# Patient Record
Sex: Male | Born: 1995 | Race: White | Hispanic: No | Marital: Single
Health system: Southern US, Community
[De-identification: ages and names within clinical notes are randomized; demographics above are authoritative.]

## PROBLEM LIST (undated history)

## (undated) DIAGNOSIS — R51 Headache: Secondary | ICD-10-CM

## (undated) DIAGNOSIS — L0591 Pilonidal cyst without abscess: Secondary | ICD-10-CM

## (undated) DIAGNOSIS — R198 Other specified symptoms and signs involving the digestive system and abdomen: Secondary | ICD-10-CM

## (undated) HISTORY — PX: ADENOIDECTOMY: SUR15

## (undated) HISTORY — PX: UPPER GI ENDOSCOPY: SHX6162

## (undated) HISTORY — PX: TOOTH EXTRACTION: SUR596

## (undated) HISTORY — PX: TYMPANOSTOMY TUBE PLACEMENT: SHX32

---
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
## 2017-04-19 IMAGING — CT CT ANGIO CHEST
1 of 2 series · 18 of 30 positions shown · IV contrast (APPLIED)
Comparison: None.

CLINICAL DATA: Midsternal chest pain, nausea, and shortness of
breath starting around 11 o'clock this morning. Pain worsening
throughout the day.

EXAM:
CT ANGIOGRAPHY CHEST WITH CONTRAST
TECHNIQUE: Multidetector CT imaging of the chest was performed using the
standard protocol during bolus administration of intravenous
contrast. Multiplanar CT image reconstructions and MIPs were
obtained to evaluate the vascular anatomy.
CONTRAST:  100mL OMNIPAQUE IOHEXOL 350 MG/ML SOLN

[Series 5: pe 1.0 thins · axial · 0.82mm/px · z∈[-644,-384]mm · 18 of 293 slices shown]
[im 17/293  lung]
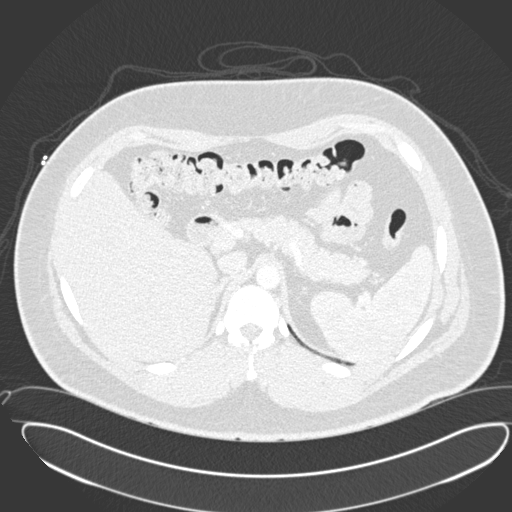
[im 33/293  mediastinal]
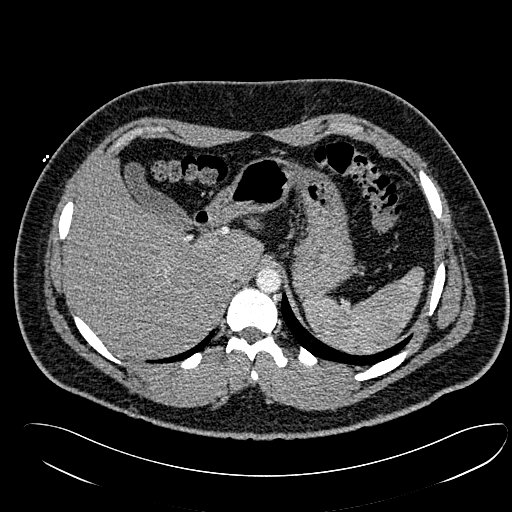
[im 49/293  lung]
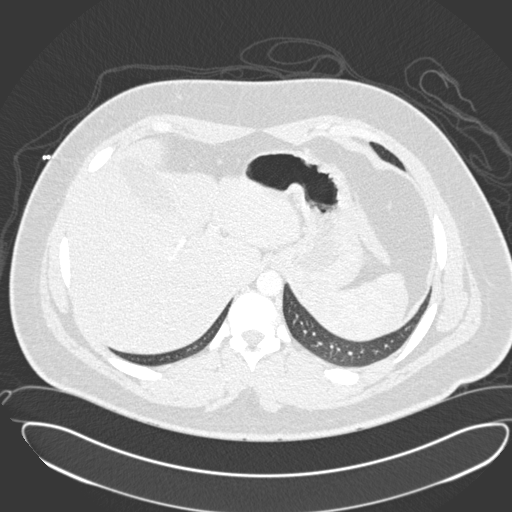
[im 65/293  mediastinal]
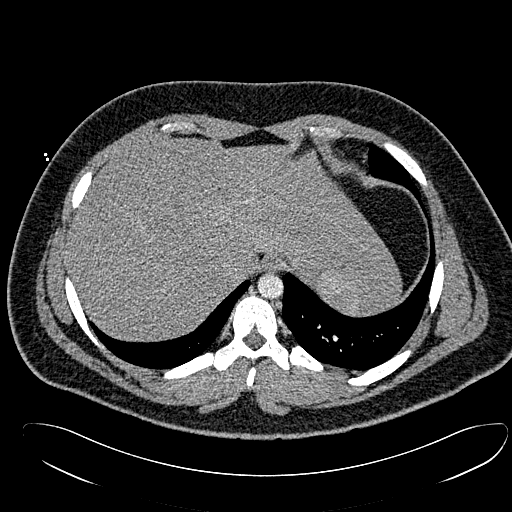
[im 82/293  lung]
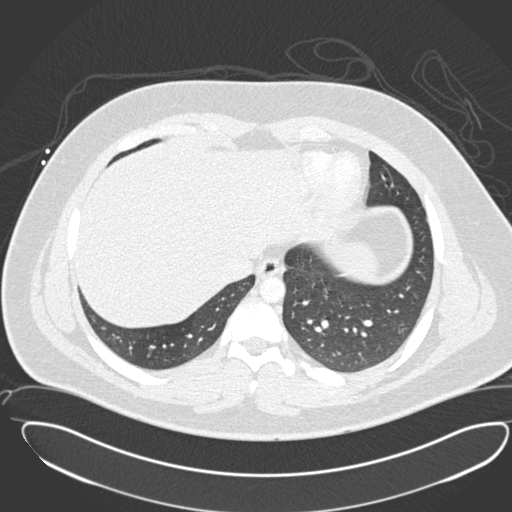
[im 98/293  mediastinal]
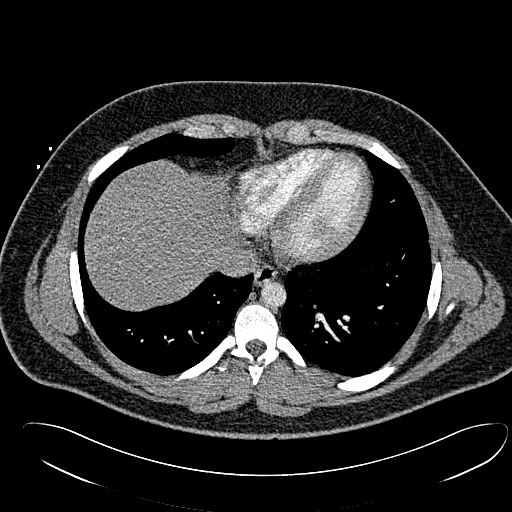
[im 114/293  lung]
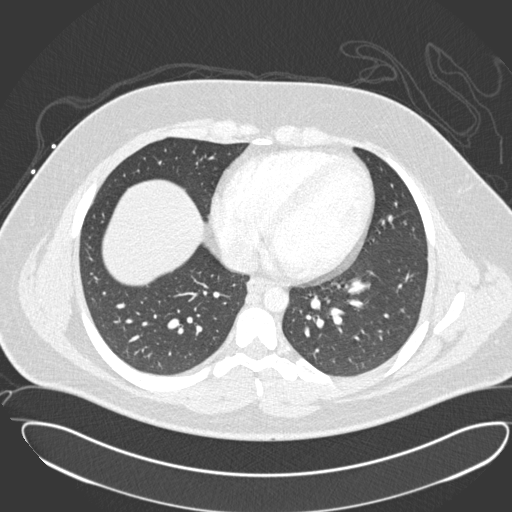
[im 130/293  mediastinal]
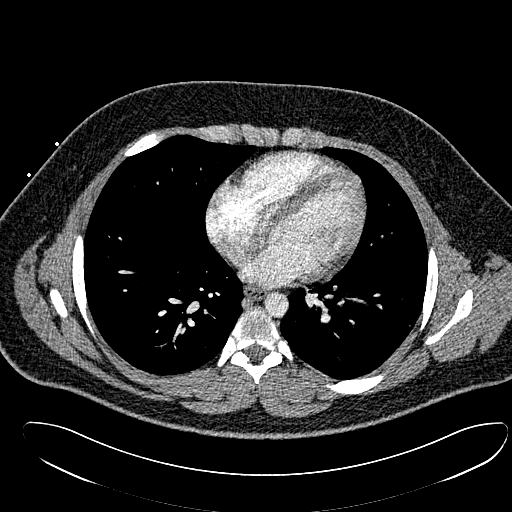
[im 137/293  lung]
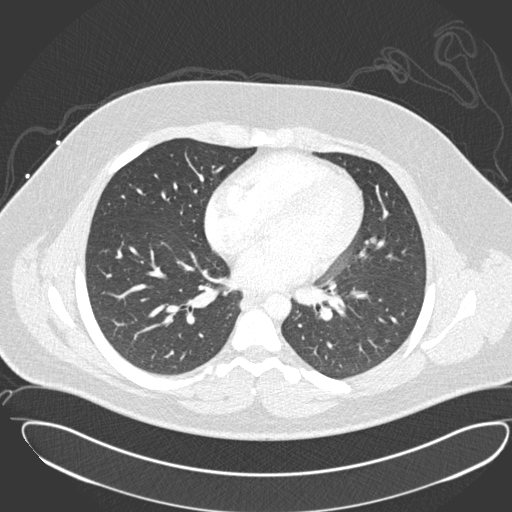
[im 147/293  mediastinal]
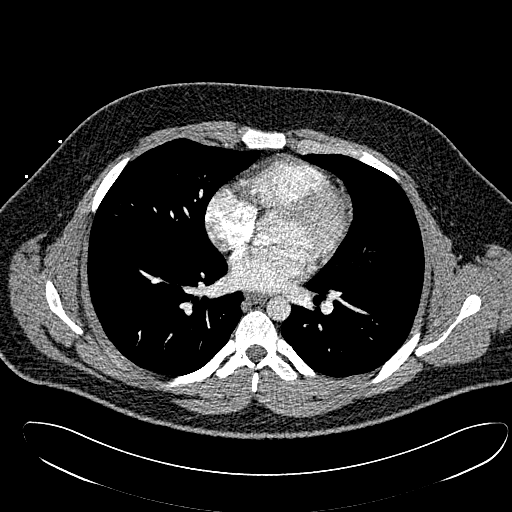
[im 163/293  lung]
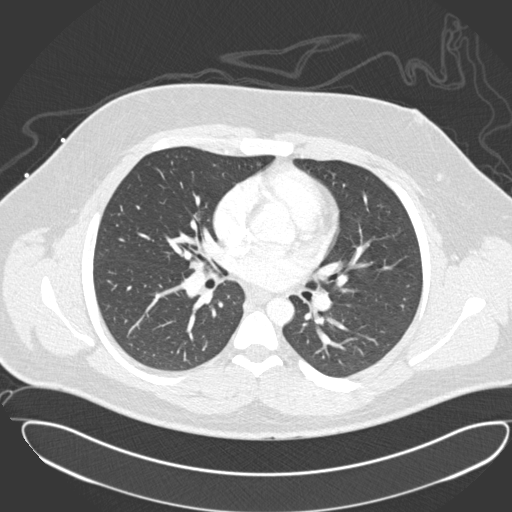
[im 179/293  mediastinal]
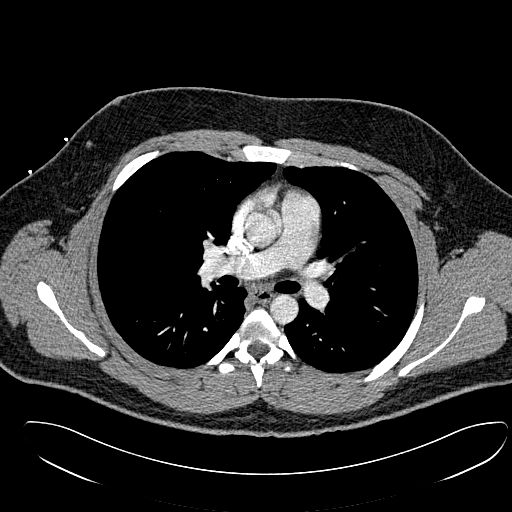
[im 195/293  lung]
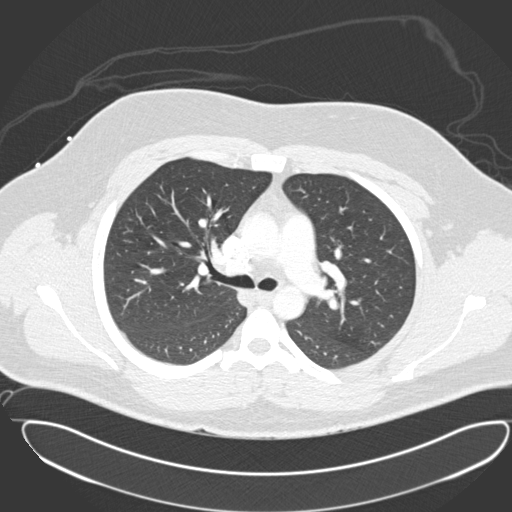
[im 211/293  mediastinal]
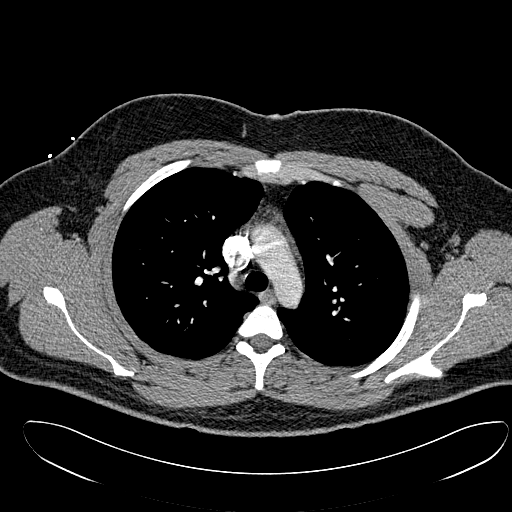
[im 228/293  lung]
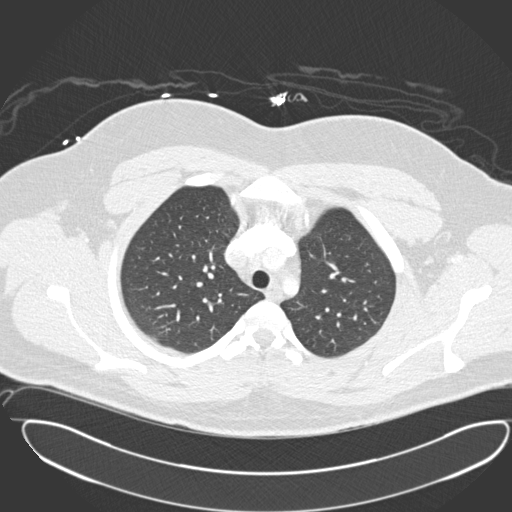
[im 244/293  mediastinal]
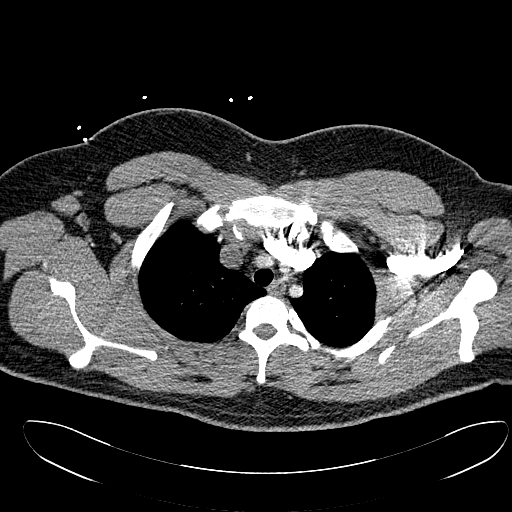
[im 260/293  lung]
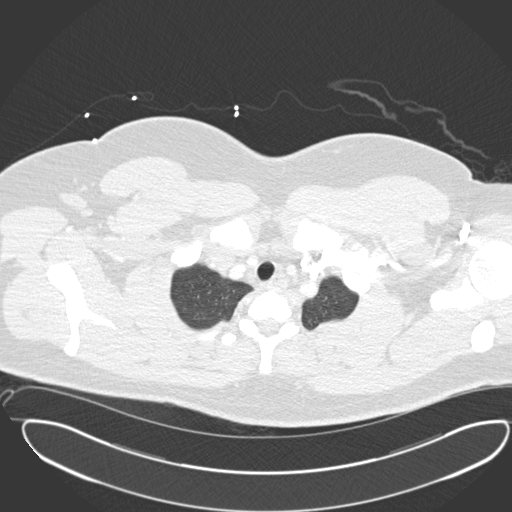
[im 276/293  mediastinal]
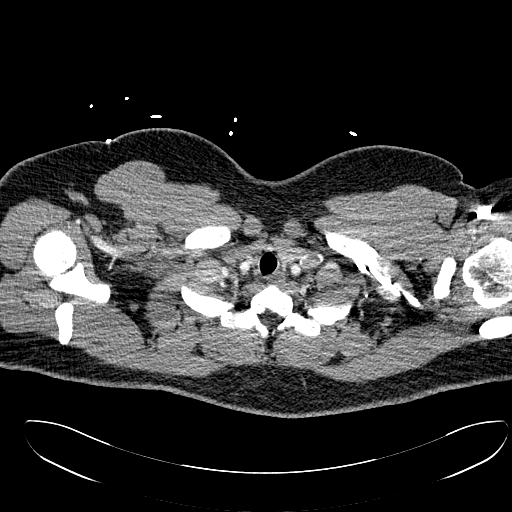

[18 of 30 positions shown; findings below may reference images not displayed]

FINDINGS: Technically limited study with poor opacification of the pulmonary
arteries due to bolus timing. The main pulmonary arteries are
moderately well seen and no large filling defects are demonstrated.
Segmental branch vessel pulmonary emboli cannot be excluded.

Normal heart size. Normal caliber thoracic aorta. No aortic
aneurysm. Probable residual thymic tissue in the anterior
mediastinum. Esophagus is decompressed. No significant
lymphadenopathy in the chest.

Lungs are clear. No focal airspace disease or consolidation. No
pneumothorax. No pleural effusions.

Included portions of the upper abdominal organs are grossly
unremarkable.

Review of the MIP images confirms the above findings.
IMPRESSION: Technically limited study. No large pulmonary embolus demonstrated
in the central vessels but examination is indeterminate for
evaluation of pulmonary emboli in the segmental and subsegmental
branches. No evidence of active pulmonary disease.

## 2017-10-11 ENCOUNTER — Other Ambulatory Visit: Payer: Self-pay | Admitting: Primary Care

## 2017-10-11 DIAGNOSIS — S0990XA Unspecified injury of head, initial encounter: Secondary | ICD-10-CM

## 2017-10-16 ENCOUNTER — Ambulatory Visit: Payer: BLUE CROSS/BLUE SHIELD

## 2017-10-30 ENCOUNTER — Ambulatory Visit: Admission: RE | Admit: 2017-10-30 | Payer: BLUE CROSS/BLUE SHIELD | Source: Ambulatory Visit

## 2021-07-13 ENCOUNTER — Other Ambulatory Visit: Payer: Self-pay | Admitting: Internal Medicine

## 2021-07-13 ENCOUNTER — Other Ambulatory Visit
Admission: RE | Admit: 2021-07-13 | Discharge: 2021-07-13 | Disposition: A | Discharge status: Home / Self Care | Payer: BC Managed Care – PPO | Source: Ambulatory Visit | Attending: Internal Medicine | Admitting: Internal Medicine

## 2021-07-13 ENCOUNTER — Other Ambulatory Visit (HOSPITAL_COMMUNITY): Payer: Self-pay | Admitting: Internal Medicine

## 2021-07-13 DIAGNOSIS — I2699 Other pulmonary embolism without acute cor pulmonale: Secondary | ICD-10-CM | POA: Diagnosis present

## 2021-07-13 DIAGNOSIS — R7989 Other specified abnormal findings of blood chemistry: Secondary | ICD-10-CM | POA: Diagnosis present

## 2021-07-13 DIAGNOSIS — R0602 Shortness of breath: Secondary | ICD-10-CM

## 2021-07-13 LAB — D-DIMER, QUANTITATIVE: D-Dimer, Quant: 0.34 ug/mL-FEU (ref 0.00–0.50)

## 2021-11-09 ENCOUNTER — Ambulatory Visit: Payer: BC Managed Care – PPO | Attending: Specialist

## 2021-11-09 DIAGNOSIS — G4733 Obstructive sleep apnea (adult) (pediatric): Secondary | ICD-10-CM | POA: Insufficient documentation

## 2022-01-03 ENCOUNTER — Other Ambulatory Visit: Payer: Self-pay | Admitting: Student

## 2022-01-03 DIAGNOSIS — R0789 Other chest pain: Secondary | ICD-10-CM

## 2022-01-06 ENCOUNTER — Ambulatory Visit
Admission: RE | Admit: 2022-01-06 | Discharge: 2022-01-06 | Disposition: A | Discharge status: Home / Self Care | Payer: BC Managed Care – PPO | Source: Ambulatory Visit | Attending: Student | Admitting: Student

## 2022-01-06 DIAGNOSIS — R0789 Other chest pain: Secondary | ICD-10-CM | POA: Insufficient documentation

## 2022-09-09 ENCOUNTER — Other Ambulatory Visit: Payer: Self-pay

## 2022-09-09 ENCOUNTER — Emergency Department
Admission: EM | Admit: 2022-09-09 | Discharge: 2022-09-09 | Disposition: A | Discharge status: Home / Self Care | Payer: BC Managed Care – PPO | Source: Home / Self Care | Attending: Emergency Medicine | Admitting: Emergency Medicine

## 2022-09-09 ENCOUNTER — Encounter: Payer: Self-pay | Admitting: Emergency Medicine

## 2022-09-09 ENCOUNTER — Emergency Department: Payer: BC Managed Care – PPO

## 2022-09-09 DIAGNOSIS — R1031 Right lower quadrant pain: Secondary | ICD-10-CM | POA: Insufficient documentation

## 2022-09-09 LAB — COMPREHENSIVE METABOLIC PANEL
ALT: 29 U/L (ref 0–44)
AST: 26 U/L (ref 15–41)
Albumin: 4 g/dL (ref 3.5–5.0)
Alkaline Phosphatase: 44 U/L (ref 38–126)
Anion gap: 8 (ref 5–15)
BUN: 9 mg/dL (ref 6–20)
CO2: 24 mmol/L (ref 22–32)
Calcium: 9.3 mg/dL (ref 8.9–10.3)
Chloride: 106 mmol/L (ref 98–111)
Creatinine, Ser: 1 mg/dL (ref 0.61–1.24)
GFR, Estimated: >60 mL/min (ref >60)
Glucose, Bld: 122 mg/dL — ABNORMAL HIGH (ref 70–99)
Potassium: 3.9 mmol/L (ref 3.5–5.1)
Sodium: 138 mmol/L (ref 135–145)
Total Bilirubin: 0.5 mg/dL (ref 0.3–1.2)
Total Protein: 7 g/dL (ref 6.5–8.1)

## 2022-09-09 LAB — URINALYSIS, ROUTINE W REFLEX MICROSCOPIC
Bilirubin Urine: NEGATIVE
Glucose, UA: NEGATIVE mg/dL
Hgb urine dipstick: NEGATIVE
Ketones, ur: NEGATIVE mg/dL
Leukocytes,Ua: NEGATIVE
Nitrite: NEGATIVE
Protein, ur: NEGATIVE mg/dL
Specific Gravity, Urine: 1.01 (ref 1.005–1.030)
pH: 7 (ref 5.0–8.0)

## 2022-09-09 LAB — CBC
HCT: 43.4 % (ref 39.0–52.0)
Hemoglobin: 14.4 g/dL (ref 13.0–17.0)
MCH: 28.4 pg (ref 26.0–34.0)
MCHC: 33.2 g/dL (ref 30.0–36.0)
MCV: 85.6 fL (ref 80.0–100.0)
Platelets: 310 10*3/uL (ref 150–400)
RBC: 5.07 MIL/uL (ref 4.22–5.81)
RDW: 13 % (ref 11.5–15.5)
WBC: 6.9 10*3/uL (ref 4.0–10.5)
nRBC: 0 % (ref 0.0–0.2)

## 2022-09-09 LAB — LIPASE, BLOOD: Lipase: 21 U/L (ref 11–51)

## 2022-09-09 MED ORDER — ONDANSETRON HCL 4 MG/2ML IJ SOLN
4.0000 mg | Freq: Once | INTRAMUSCULAR | Status: AC
  Administered 2022-09-09: 4 mg via INTRAVENOUS
  Filled 2022-09-09: qty 2

## 2022-09-09 MED ORDER — SODIUM CHLORIDE 0.9 % IV BOLUS
1000.0000 mL | Freq: Once | INTRAVENOUS | Status: AC
  Administered 2022-09-09: 1000 mL via INTRAVENOUS

## 2022-09-09 MED ORDER — IOHEXOL 300 MG/ML  SOLN
100.0000 mL | Freq: Once | INTRAMUSCULAR | Status: AC | PRN
  Administered 2022-09-09: 100 mL via INTRAVENOUS

## 2022-09-09 NOTE — ED Triage Notes (Signed)
Pt here with co lower abd pain x 2 days. Pt co nausea no n.v.d. Pt denies any dysuria.

## 2022-09-09 NOTE — ED Notes (Signed)
Triage done by Everlene Farrier RN, documented in error under [facility] EDT.

## 2022-09-09 NOTE — ED Provider Notes (Signed)
[facility] Provider Note    Event Date/Time   First MD Initiated Contact with Patient 09/09/22 1212     (approximate)   History   Abdominal Pain   HPI  Gerald Velazquez is a 27 y.o. male who presents today for evaluation of right lower quadrant pain.  He reports that his pain is worsened with walking.  He reports that he has nausea but has not vomited yet.  No diarrhea.  He reports that he does not have pain when pushing on the area.  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  He reports that he was told to go to the emergency department for evaluation of appendicitis.  There are no problems to display for this patient.         Physical Exam   Triage Vital Signs: ED Triage Vitals [09/09/22 1208]  Encounter Vitals Group     BP (!) 153/93     Systolic BP Percentile      Diastolic BP Percentile      Pulse Rate 96     Resp 17     Temp 99.2 F (37.3 C)     Temp src      SpO2 99 %     Weight 298 lb (135.2 kg)     Height 6\' 1"  (1.854 m)     Head Circumference      Peak Flow      Pain Score 3     Pain Loc      Pain Education      Exclude from Growth Chart     Most recent vital signs: Vitals:   09/09/22 1208  BP: (!) 153/93  Pulse: 96  Resp: 17  Temp: 99.2 F (37.3 C)  SpO2: 99%    Physical Exam Vitals and nursing note reviewed.  Constitutional:      General: Awake and alert. No acute distress.    Appearance: Normal appearance. The patient is obese.  HENT:     Head: Normocephalic and atraumatic.     Mouth: Mucous membranes are moist.  Eyes:     General: PERRL. Normal EOMs        Right eye: No discharge.        Left eye: No discharge.     Conjunctiva/sclera: Conjunctivae normal.  Cardiovascular:     Rate and Rhythm: Normal rate and regular rhythm.     Pulses: Normal pulses.  Pulmonary:     Effort: Pulmonary effort is normal. No respiratory distress.     Breath sounds: Normal breath sounds.  Abdominal:     Abdomen  is soft. There is no abdominal tenderness. No rebound or guarding. No distention.  No CVA tenderness. Musculoskeletal:        General: No swelling. Normal range of motion.     Cervical back: Normal range of motion and neck supple.  Skin:    General: Skin is warm and dry.     Capillary Refill: Capillary refill takes less than 2 seconds.     Findings: No rash.  Neurological:     Mental Status: The patient is awake and alert.      ED Results / Procedures / Treatments   Labs (all labs ordered are listed, but only abnormal results are displayed) Labs Reviewed  COMPREHENSIVE METABOLIC PANEL - Abnormal; Notable for the following components:      Result Value   Glucose, Bld 122 (*)    All other  components within normal limits  URINALYSIS, ROUTINE W REFLEX MICROSCOPIC - Abnormal; Notable for the following components:   Color, Urine YELLOW (*)    APPearance CLEAR (*)    All other components within normal limits  CBC  LIPASE, BLOOD     EKG     RADIOLOGY I independently reviewed and interpreted imaging and agree with radiologists findings.     PROCEDURES:  Critical Care performed:   Procedures   MEDICATIONS ORDERED IN ED: Medications  sodium chloride 0.9 % bolus 1,000 mL (0 mLs Intravenous Stopped 09/09/22 1436)  ondansetron (ZOFRAN) injection 4 mg (4 mg Intravenous Given 09/09/22 1310)  iohexol (OMNIPAQUE) 300 MG/ML solution 100 mL (100 mLs Intravenous Contrast Given 09/09/22 1255)     IMPRESSION / MDM / ASSESSMENT AND PLAN / ED COURSE  I reviewed the triage vital signs and the nursing notes.   Differential diagnosis includes, but is not limited to, appendicitis, nephrolithiasis, musculoskeletal injury, hernia, UTI.  Patient is awake and alert, hemodynamically stable and afebrile.  He has really no reproducible tenderness to palpation throughout his abdomen, though has pain in his right lower quadrant when lifting up his leg.  He has a negative psoas, obturator and  Rovsing signs.  There is no visible hernia or skin discoloration here.  I reviewed the patient's chart.  Patient was seen at the [facility] walk-in clinic today and they advised that he come to the emergency department for workup of intra-abdominal pathology.  He declined analgesia given that he reports he does not really have much pain.  He did agree to Zofran.  IV was established and labs were obtained.  Labs are overall reassuring.  No leukocytosis.  Normal creatinine, normal LFTs and lipase.  Urinalysis does not reveal evidence of urinary tract infection, and no hematuria.  Patient agreed to CT scan for further evaluation.  CT is reassuring.  There is no evidence of acute abdominal or pelvic process.  Patient and his family are reassured by these findings.  Patient reports that his pain is still quite minimal, and his nausea has resolved.  He is able to tolerate p.o.  We discussed return precautions and outpatient follow-up.  Patient understands and agrees with plan.  He was discharged in stable condition.  Patient's presentation is most consistent with acute complicated illness / injury requiring diagnostic workup.   FINAL CLINICAL IMPRESSION(S) / ED DIAGNOSES   Final diagnoses:  Right lower quadrant abdominal pain     Rx / DC Orders   ED Discharge Orders     None        Note:  This document was prepared using Dragon voice recognition software and may include unintentional dictation errors.   Keturah Shavers 09/09/22 1448    Chesley Noon, MD 09/10/22 1525

## 2022-09-09 NOTE — Discharge Instructions (Signed)
Your CT scan, blood work, and urinalysis were normal.  You may take Tylenol/ibuprofen per package instructions as needed for any discomfort.  Please return for any new, worsening, or change in symptoms or other concerns.  It was a pleasure caring for you today.

## 2022-12-20 ENCOUNTER — Other Ambulatory Visit: Payer: Self-pay | Admitting: Physician Assistant

## 2022-12-20 DIAGNOSIS — H53149 Visual discomfort, unspecified: Secondary | ICD-10-CM

## 2022-12-20 DIAGNOSIS — Z87820 Personal history of traumatic brain injury: Secondary | ICD-10-CM

## 2022-12-20 DIAGNOSIS — R519 Headache, unspecified: Secondary | ICD-10-CM
